# Patient Record
Sex: Female | Born: 1960 | Race: White | Hispanic: No | Marital: Married | Smoking: Never smoker
Health system: Southern US, Community
[De-identification: ages and names within clinical notes are randomized; demographics above are authoritative.]

## PROBLEM LIST (undated history)

## (undated) DIAGNOSIS — F32A Depression, unspecified: Secondary | ICD-10-CM

## (undated) DIAGNOSIS — F329 Major depressive disorder, single episode, unspecified: Secondary | ICD-10-CM

## (undated) HISTORY — PX: OTHER SURGICAL HISTORY: SHX169

## (undated) HISTORY — PX: TONSILLECTOMY: SUR1361

## (undated) HISTORY — PX: APPENDECTOMY: SHX54

---

## 2001-08-11 HISTORY — PX: ABDOMINAL HERNIA REPAIR: SHX539

## 2012-03-28 ENCOUNTER — Encounter (HOSPITAL_COMMUNITY)
Admission: EM | Disposition: A | Discharge status: Home / Self Care | Payer: Self-pay | Source: Home / Self Care | Attending: Orthopedic Surgery

## 2012-03-28 ENCOUNTER — Emergency Department (HOSPITAL_COMMUNITY): Payer: 59

## 2012-03-28 ENCOUNTER — Inpatient Hospital Stay (HOSPITAL_COMMUNITY): Payer: 59

## 2012-03-28 ENCOUNTER — Encounter (HOSPITAL_COMMUNITY): Payer: Self-pay | Admitting: Certified Registered Nurse Anesthetist

## 2012-03-28 ENCOUNTER — Encounter (HOSPITAL_COMMUNITY): Payer: Self-pay

## 2012-03-28 ENCOUNTER — Emergency Department (HOSPITAL_COMMUNITY): Payer: 59 | Admitting: Certified Registered Nurse Anesthetist

## 2012-03-28 ENCOUNTER — Inpatient Hospital Stay (HOSPITAL_COMMUNITY)
Admission: EM | Admit: 2012-03-28 | Discharge: 2012-03-30 | DRG: 494 | Disposition: A | Discharge status: Home / Self Care | Payer: 59 | Attending: Orthopedic Surgery | Admitting: Orthopedic Surgery

## 2012-03-28 DIAGNOSIS — F411 Generalized anxiety disorder: Secondary | ICD-10-CM | POA: Diagnosis present

## 2012-03-28 DIAGNOSIS — Y929 Unspecified place or not applicable: Secondary | ICD-10-CM

## 2012-03-28 DIAGNOSIS — S82843A Displaced bimalleolar fracture of unspecified lower leg, initial encounter for closed fracture: Secondary | ICD-10-CM

## 2012-03-28 DIAGNOSIS — F3289 Other specified depressive episodes: Secondary | ICD-10-CM | POA: Diagnosis present

## 2012-03-28 DIAGNOSIS — Z8249 Family history of ischemic heart disease and other diseases of the circulatory system: Secondary | ICD-10-CM

## 2012-03-28 DIAGNOSIS — W010XXA Fall on same level from slipping, tripping and stumbling without subsequent striking against object, initial encounter: Secondary | ICD-10-CM | POA: Diagnosis present

## 2012-03-28 DIAGNOSIS — F329 Major depressive disorder, single episode, unspecified: Secondary | ICD-10-CM | POA: Diagnosis present

## 2012-03-28 DIAGNOSIS — S82853A Displaced trimalleolar fracture of unspecified lower leg, initial encounter for closed fracture: Principal | ICD-10-CM | POA: Diagnosis present

## 2012-03-28 DIAGNOSIS — Z79899 Other long term (current) drug therapy: Secondary | ICD-10-CM

## 2012-03-28 HISTORY — PX: ORIF ANKLE FRACTURE: SHX5408

## 2012-03-28 HISTORY — DX: Depression, unspecified: F32.A

## 2012-03-28 HISTORY — DX: Major depressive disorder, single episode, unspecified: F32.9

## 2012-03-28 LAB — ETHANOL: Alcohol, Ethyl (B): 159 mg/dL — ABNORMAL HIGH (ref 0–11)

## 2012-03-28 LAB — CBC WITH DIFFERENTIAL/PLATELET
Basophils Relative: 1 % (ref 0–1)
Hemoglobin: 13.9 g/dL (ref 12.0–15.0)
Lymphocytes Relative: 33 % (ref 12–46)
Lymphs Abs: 2.7 10*3/uL (ref 0.7–4.0)
MCHC: 35.7 g/dL (ref 30.0–36.0)
Monocytes Relative: 6 % (ref 3–12)
Neutro Abs: 4.8 10*3/uL (ref 1.7–7.7)
Neutrophils Relative %: 58 % (ref 43–77)
RBC: 4.36 MIL/uL (ref 3.87–5.11)
WBC: 8.2 10*3/uL (ref 4.0–10.5)

## 2012-03-28 LAB — TYPE AND SCREEN

## 2012-03-28 LAB — BASIC METABOLIC PANEL
BUN: 13 mg/dL (ref 6–23)
CO2: 27 mEq/L (ref 19–32)
Chloride: 102 mEq/L (ref 96–112)
GFR calc Af Amer: >90 mL/min (ref >90)
Potassium: 3.3 mEq/L — ABNORMAL LOW (ref 3.5–5.1)

## 2012-03-28 LAB — ABO/RH: ABO/RH(D): O POS

## 2012-03-28 SURGERY — OPEN REDUCTION INTERNAL FIXATION (ORIF) ANKLE FRACTURE
Anesthesia: Regional | Site: Ankle | Laterality: Right | Wound class: Clean

## 2012-03-28 MED ORDER — ADULT MULTIVITAMIN W/MINERALS CH
1.0000 | ORAL_TABLET | Freq: Every day | ORAL | Status: DC
  Administered 2012-03-30: 1 via ORAL
  Filled 2012-03-28 (×2): qty 1

## 2012-03-28 MED ORDER — LACTATED RINGERS IV SOLN
INTRAVENOUS | Status: DC | PRN
  Administered 2012-03-28 (×2): via INTRAVENOUS

## 2012-03-28 MED ORDER — CEFAZOLIN SODIUM-DEXTROSE 2-3 GM-% IV SOLR
INTRAVENOUS | Status: DC | PRN
  Administered 2012-03-28: 2 g via INTRAVENOUS

## 2012-03-28 MED ORDER — HYDROMORPHONE HCL PF 1 MG/ML IJ SOLN
1.0000 mg | Freq: Once | INTRAMUSCULAR | Status: AC
  Administered 2012-03-28: 1 mg via INTRAVENOUS
  Filled 2012-03-28: qty 1

## 2012-03-28 MED ORDER — DEXTROSE 5 % IV SOLN: 2.0000 g | INTRAVENOUS | Status: DC | PRN

## 2012-03-28 MED ORDER — MIDAZOLAM HCL 5 MG/5ML IJ SOLN
INTRAMUSCULAR | Status: DC | PRN
  Administered 2012-03-28: 2 mg via INTRAVENOUS

## 2012-03-28 MED ORDER — FENTANYL CITRATE 0.05 MG/ML IJ SOLN
INTRAMUSCULAR | Status: DC | PRN
  Administered 2012-03-28: 100 ug via INTRAVENOUS

## 2012-03-28 MED ORDER — ETOMIDATE 2 MG/ML IV SOLN
10.0000 mg | Freq: Once | INTRAVENOUS | Status: DC
  Filled 2012-03-28: qty 10

## 2012-03-28 MED ORDER — ONDANSETRON HCL 4 MG/2ML IJ SOLN: 4.0000 mg | Freq: Four times a day (QID) | INTRAMUSCULAR | Status: DC | PRN

## 2012-03-28 MED ORDER — METOCLOPRAMIDE HCL 5 MG/ML IJ SOLN
5.0000 mg | Freq: Three times a day (TID) | INTRAMUSCULAR | Status: DC | PRN
  Administered 2012-03-30: 10 mg via INTRAVENOUS
  Filled 2012-03-28: qty 2

## 2012-03-28 MED ORDER — METHOCARBAMOL 100 MG/ML IJ SOLN
500.0000 mg | Freq: Four times a day (QID) | INTRAVENOUS | Status: DC | PRN
  Administered 2012-03-28: 500 mg via INTRAVENOUS
  Filled 2012-03-28: qty 5

## 2012-03-28 MED ORDER — CEFAZOLIN SODIUM 1-5 GM-% IV SOLN
1.0000 g | Freq: Three times a day (TID) | INTRAVENOUS | Status: AC
  Administered 2012-03-28 – 2012-03-29 (×3): 1 g via INTRAVENOUS
  Filled 2012-03-28 (×3): qty 50

## 2012-03-28 MED ORDER — COUMADIN BOOK
Freq: Once | Status: AC
  Administered 2012-03-28: 16:00:00
  Filled 2012-03-28: qty 1

## 2012-03-28 MED ORDER — ETOMIDATE 2 MG/ML IV SOLN
12.0000 mg | Freq: Once | INTRAVENOUS | Status: AC
  Administered 2012-03-28: 12 mg via INTRAVENOUS
  Filled 2012-03-28: qty 10

## 2012-03-28 MED ORDER — PHENYLEPHRINE HCL 10 MG/ML IJ SOLN
INTRAMUSCULAR | Status: DC | PRN
  Administered 2012-03-28: 120 ug via INTRAVENOUS
  Administered 2012-03-28 (×5): 80 ug via INTRAVENOUS

## 2012-03-28 MED ORDER — DIPHENHYDRAMINE HCL 50 MG/ML IJ SOLN
12.5000 mg | Freq: Once | INTRAMUSCULAR | Status: AC
  Administered 2012-03-28: 12.5 mg via INTRAVENOUS
  Filled 2012-03-28: qty 1

## 2012-03-28 MED ORDER — POTASSIUM CHLORIDE IN NACL 20-0.9 MEQ/L-% IV SOLN
INTRAVENOUS | Status: AC
  Administered 2012-03-28: via INTRAVENOUS
  Filled 2012-03-28: qty 1000

## 2012-03-28 MED ORDER — METOCLOPRAMIDE HCL 10 MG PO TABS: 5.0000 mg | ORAL_TABLET | Freq: Three times a day (TID) | ORAL | Status: DC | PRN

## 2012-03-28 MED ORDER — METHOCARBAMOL 500 MG PO TABS
500.0000 mg | ORAL_TABLET | Freq: Four times a day (QID) | ORAL | Status: DC | PRN
  Administered 2012-03-28 – 2012-03-29 (×3): 500 mg via ORAL
  Filled 2012-03-28 (×4): qty 1

## 2012-03-28 MED ORDER — OXYCODONE HCL 5 MG PO TABS
5.0000 mg | ORAL_TABLET | ORAL | Status: DC | PRN
  Administered 2012-03-28 – 2012-03-30 (×4): 10 mg via ORAL
  Filled 2012-03-28 (×4): qty 2

## 2012-03-28 MED ORDER — HYDROMORPHONE HCL PF 1 MG/ML IJ SOLN
0.2500 mg | INTRAMUSCULAR | Status: DC | PRN
  Administered 2012-03-28 (×2): 0.5 mg via INTRAVENOUS

## 2012-03-28 MED ORDER — WARFARIN - PHARMACIST DOSING INPATIENT
Freq: Every day | Status: DC
  Administered 2012-03-28: 18:00:00

## 2012-03-28 MED ORDER — BUPIVACAINE-EPINEPHRINE PF 0.5-1:200000 % IJ SOLN
INTRAMUSCULAR | Status: DC | PRN
  Administered 2012-03-28: 30 mL

## 2012-03-28 MED ORDER — EPHEDRINE SULFATE 50 MG/ML IJ SOLN
INTRAMUSCULAR | Status: DC | PRN
  Administered 2012-03-28 (×2): 10 mg via INTRAVENOUS
  Administered 2012-03-28: 15 mg via INTRAVENOUS
  Administered 2012-03-28: 5 mg via INTRAVENOUS

## 2012-03-28 MED ORDER — WARFARIN VIDEO: Freq: Once | Status: DC

## 2012-03-28 MED ORDER — 0.9 % SODIUM CHLORIDE (POUR BTL) OPTIME
TOPICAL | Status: DC | PRN
  Administered 2012-03-28: 1000 mL

## 2012-03-28 MED ORDER — ONDANSETRON HCL 4 MG/2ML IJ SOLN
4.0000 mg | Freq: Four times a day (QID) | INTRAMUSCULAR | Status: DC | PRN
  Administered 2012-03-28 – 2012-03-30 (×3): 4 mg via INTRAVENOUS
  Filled 2012-03-28 (×3): qty 2

## 2012-03-28 MED ORDER — ONDANSETRON HCL 4 MG/2ML IJ SOLN
4.0000 mg | Freq: Once | INTRAMUSCULAR | Status: AC
  Administered 2012-03-28: 4 mg via INTRAVENOUS
  Filled 2012-03-28: qty 2

## 2012-03-28 MED ORDER — HYDROMORPHONE HCL PF 1 MG/ML IJ SOLN
INTRAMUSCULAR | Status: AC
  Administered 2012-03-28: 0.5 mg via INTRAVENOUS
  Filled 2012-03-28: qty 1

## 2012-03-28 MED ORDER — PRESCRIPTION MEDICATION: 1.0000 | Freq: Every day | Status: DC

## 2012-03-28 MED ORDER — LIDOCAINE HCL (CARDIAC) 20 MG/ML IV SOLN
INTRAVENOUS | Status: DC | PRN
  Administered 2012-03-28: 70 mg via INTRAVENOUS

## 2012-03-28 MED ORDER — WARFARIN SODIUM 7.5 MG PO TABS
7.5000 mg | ORAL_TABLET | Freq: Once | ORAL | Status: AC
  Administered 2012-03-28: 7.5 mg via ORAL
  Filled 2012-03-28: qty 1

## 2012-03-28 MED ORDER — MORPHINE SULFATE 2 MG/ML IJ SOLN
1.0000 mg | INTRAMUSCULAR | Status: DC | PRN
  Administered 2012-03-28 – 2012-03-29 (×4): 1 mg via INTRAVENOUS
  Filled 2012-03-28 (×3): qty 1

## 2012-03-28 MED ORDER — ONDANSETRON HCL 4 MG PO TABS: 4.0000 mg | ORAL_TABLET | Freq: Four times a day (QID) | ORAL | Status: DC | PRN

## 2012-03-28 MED ORDER — PROPOFOL 10 MG/ML IV BOLUS
INTRAVENOUS | Status: DC | PRN
  Administered 2012-03-28: 200 mg via INTRAVENOUS

## 2012-03-28 MED ORDER — ESCITALOPRAM OXALATE 10 MG PO TABS
10.0000 mg | ORAL_TABLET | Freq: Every day | ORAL | Status: DC
  Filled 2012-03-28 (×3): qty 1

## 2012-03-28 SURGICAL SUPPLY — 71 items
BANDAGE ELASTIC 4 VELCRO ST LF (GAUZE/BANDAGES/DRESSINGS) ×2 IMPLANT
BANDAGE ELASTIC 6 VELCRO ST LF (GAUZE/BANDAGES/DRESSINGS) ×2 IMPLANT
BANDAGE GAUZE ELAST BULKY 4 IN (GAUZE/BANDAGES/DRESSINGS) IMPLANT
BIT DRILL 2.7 QC CANN 155 (BIT) ×2 IMPLANT
BIT DRILL 3.5 QC 155 (BIT) ×2 IMPLANT
BIT DRILL QC 2.7 6.3IN  SHORT (BIT) ×2
BIT DRILL QC 2.7 6.3IN SHORT (BIT) ×2 IMPLANT
BLADE SURG 10 STRL SS (BLADE) IMPLANT
BNDG COHESIVE 6X5 TAN STRL LF (GAUZE/BANDAGES/DRESSINGS) ×2 IMPLANT
BNDG ESMARK 4X9 LF (GAUZE/BANDAGES/DRESSINGS) ×2 IMPLANT
CLOTH BEACON ORANGE TIMEOUT ST (SAFETY) ×2 IMPLANT
CONT SPEC 4OZ CLIKSEAL STRL BL (MISCELLANEOUS) ×4 IMPLANT
COVER MAYO STAND STRL (DRAPES) ×2 IMPLANT
COVER SURGICAL LIGHT HANDLE (MISCELLANEOUS) ×2 IMPLANT
CUFF TOURNIQUET SINGLE 34IN LL (TOURNIQUET CUFF) ×2 IMPLANT
CUFF TOURNIQUET SINGLE 44IN (TOURNIQUET CUFF) IMPLANT
DRAPE C-ARM 42X72 X-RAY (DRAPES) ×2 IMPLANT
DRAPE INCISE IOBAN 66X45 STRL (DRAPES) ×2 IMPLANT
DRAPE SURG 17X23 STRL (DRAPES) ×2 IMPLANT
DRAPE U-SHAPE 47X51 STRL (DRAPES) ×2 IMPLANT
DRSG PAD ABDOMINAL 8X10 ST (GAUZE/BANDAGES/DRESSINGS) ×2 IMPLANT
DURAPREP 26ML APPLICATOR (WOUND CARE) ×2 IMPLANT
ELECT REM PT RETURN 9FT ADLT (ELECTROSURGICAL) ×2
ELECTRODE REM PT RTRN 9FT ADLT (ELECTROSURGICAL) ×1 IMPLANT
FACESHIELD LNG OPTICON STERILE (SAFETY) IMPLANT
GAUZE XEROFORM 5X9 LF (GAUZE/BANDAGES/DRESSINGS) ×2 IMPLANT
GLOVE BIOGEL PI IND STRL 8 (GLOVE) ×2 IMPLANT
GLOVE BIOGEL PI INDICATOR 8 (GLOVE) ×2
GLOVE SURG ORTHO 8.0 STRL STRW (GLOVE) ×2 IMPLANT
GOWN PREVENTION PLUS LG XLONG (DISPOSABLE) ×2 IMPLANT
GOWN STRL NON-REIN LRG LVL3 (GOWN DISPOSABLE) ×6 IMPLANT
GUIDE PIN 1.3 (Pin) ×4 IMPLANT
HANDPIECE INTERPULSE COAX TIP (DISPOSABLE)
KIT BASIN OR (CUSTOM PROCEDURE TRAY) ×4 IMPLANT
KIT ROOM TURNOVER OR (KITS) ×2 IMPLANT
MANIFOLD NEPTUNE II (INSTRUMENTS) ×2 IMPLANT
NEEDLE HYPO 25GX1X1/2 BEV (NEEDLE) ×2 IMPLANT
NS IRRIG 1000ML POUR BTL (IV SOLUTION) ×2 IMPLANT
PACK ORTHO EXTREMITY (CUSTOM PROCEDURE TRAY) ×2 IMPLANT
PAD ARMBOARD 7.5X6 YLW CONV (MISCELLANEOUS) ×4 IMPLANT
PAD CAST 4YDX4 CTTN HI CHSV (CAST SUPPLIES) ×1 IMPLANT
PADDING CAST COTTON 4X4 STRL (CAST SUPPLIES) ×1
PIN GUIDE 1.3 (Pin) ×2 IMPLANT
PLATE FIBULA 5 HOLE (Plate) ×2 IMPLANT
SCREW CANC 5.0X14 (Screw) ×2 IMPLANT
SCREW CANN 4.0 46MM (Screw) ×4 IMPLANT
SCREW LOCK 12X3.5XST PRLC (Screw) ×1 IMPLANT
SCREW LOCK 3.5X12 (Screw) ×1 IMPLANT
SCREW LOCK 3.5X16 (Screw) ×2 IMPLANT
SCREW LOCK 3.5X8 (Screw) ×2 IMPLANT
SCREW NON LOCK 3.5X12 (Screw) ×2 IMPLANT
SCREW NONLOCK 3.5X10 (Screw) ×4 IMPLANT
SCREW NONLOCK 3.5X18 (Screw) ×2 IMPLANT
SET HNDPC FAN SPRY TIP SCT (DISPOSABLE) IMPLANT
SPLINT PLASTER CAST XFAST 5X30 (CAST SUPPLIES) ×1 IMPLANT
SPLINT PLASTER XFAST SET 5X30 (CAST SUPPLIES) ×1
SPONGE GAUZE 4X4 12PLY (GAUZE/BANDAGES/DRESSINGS) ×2 IMPLANT
STOCKINETTE IMPERVIOUS 9X36 MD (GAUZE/BANDAGES/DRESSINGS) IMPLANT
SUCTION FRAZIER TIP 10 FR DISP (SUCTIONS) ×2 IMPLANT
SUT ETHILON 3 0 PS 1 (SUTURE) ×8 IMPLANT
SUT VIC AB 2-0 CTB1 (SUTURE) ×2 IMPLANT
SUT VIC AB 3-0 PS2 18 (SUTURE) ×3
SUT VIC AB 3-0 PS2 18XBRD (SUTURE) ×3 IMPLANT
SUT VIC AB 3-0 SH 27 (SUTURE)
SUT VIC AB 3-0 SH 27X BRD (SUTURE) IMPLANT
SYR CONTROL 10ML LL (SYRINGE) ×2 IMPLANT
TOWEL OR 17X24 6PK STRL BLUE (TOWEL DISPOSABLE) ×2 IMPLANT
TOWEL OR 17X26 10 PK STRL BLUE (TOWEL DISPOSABLE) ×2 IMPLANT
TUBE CONNECTING 12X1/4 (SUCTIONS) ×2 IMPLANT
WATER STERILE IRR 1000ML POUR (IV SOLUTION) ×2 IMPLANT
YANKAUER SUCT BULB TIP NO VENT (SUCTIONS) IMPLANT

## 2012-03-28 NOTE — ED Provider Notes (Addendum)
History     CSN: [id number]  Arrival date & time 03/28/12  4098   First MD Initiated Contact with Patient 03/28/12 [phone]      Chief Complaint  Patient presents with  . Ankle Pain  . Leg Injury    (Consider location/radiation/quality/duration/timing/severity/associated sxs/prior treatment) HPI 51 year old female presents to the emergency department with complaint of right ankle pain and deformity. She and her husband are walking home from a friend's house when she slipped on some wet grass. Patient hasn't been unable to bear weight since the injury. She reports last time she ate was around 7 PM, then had drinks at a friend's house. She denies any medical problems, no prior orthopedic injuries or surgeries. No allergies   No past medical history on file.  No past surgical history on file.  Family History  Problem Relation Age of Onset  . ALS Mother   . Heart attack Father     History  Substance Use Topics  . Smoking status: Never Smoker   . Smokeless tobacco: Not on file  . Alcohol Use: 15.0 oz/week    25 Glasses of wine per week    OB History    Grav Para Term Preterm Abortions TAB SAB Ect Mult Living   2 2 2              Review of Systems  All other systems reviewed and are negative.    Allergies  Review of patient's allergies indicates no known allergies.  Home Medications   Current Outpatient Rx  Name Route Sig Dispense Refill  . ESCITALOPRAM OXALATE 10 MG PO TABS Oral Take 10 mg by mouth at bedtime.    . ADULT MULTIVITAMIN W/MINERALS CH Oral Take 1 tablet by mouth daily.    Marland Kitchen PRESCRIPTION MEDICATION Oral Take 1 tablet by mouth daily. Oral contraceptive      BP 120/85  Pulse 82  Temp 97.7 F (36.5 C) (Oral)  Resp 15  Ht 5\' 10"  (1.778 m)  Wt 170 lb (77.111 kg)  BMI 24.39 kg/m2  SpO2 97%  LMP 03/14/2012  Physical Exam  Nursing note and vitals reviewed. Constitutional: She is oriented to person, place, and time. She appears well-developed and  well-nourished.  HENT:  Head: Normocephalic and atraumatic.  Nose: Nose normal.  Mouth/Throat: Oropharynx is clear and moist.  Eyes: Conjunctivae and EOM are normal. Pupils are equal, round, and reactive to light.  Neck: Normal range of motion. Neck supple. No JVD present. No tracheal deviation present. No thyromegaly present.  Cardiovascular: Normal rate, regular rhythm, normal heart sounds and intact distal pulses.  Exam reveals no gallop and no friction rub.   No murmur heard. Pulmonary/Chest: Effort normal and breath sounds normal. No stridor. No respiratory distress. She has no wheezes. She has no rales. She exhibits no tenderness.  Abdominal: Soft. Bowel sounds are normal. She exhibits no distension and no mass. There is no tenderness. There is no rebound and no guarding.  Musculoskeletal: She exhibits edema and tenderness.       Patient with deformity of right ankle with tenting of the skin over the medial malleolus. Patient with distal pulses and sensation intact. There is significant soft tissue swelling  Lymphadenopathy:    She has no cervical adenopathy.  Neurological: She is alert and oriented to person, place, and time.  Skin: Skin is warm and dry. No rash noted. No erythema. No pallor.  Psychiatric: She has a normal mood and affect. Her behavior is normal. Judgment  and thought content normal.    ED Course  Reduction of fracture Date/Time: 03/28/2012 4:46 AM Performed by: Olivia Mackie Authorized by: Olivia Mackie Consent: Verbal consent obtained. Written consent obtained. Risks and benefits: risks, benefits and alternatives were discussed Consent given by: patient Patient understanding: patient states understanding of the procedure being performed Patient consent: the patient's understanding of the procedure matches consent given Procedure consent: procedure consent matches procedure scheduled Relevant documents: relevant documents present and verified Test results: test  results available and properly labeled Site marked: the operative site was marked Imaging studies: imaging studies available Required items: required blood products, implants, devices, and special equipment available Patient identity confirmed: verbally with patient Time out: Immediately prior to procedure a "time out" was called to verify the correct patient, procedure, equipment, support staff and site/side marked as required. Local anesthesia used: no Patient sedated: yes Sedation type: moderate (conscious) sedation Sedatives: etomidate Sedation start date/time: 03/28/2012 4:46 AM Sedation end date/time: 03/28/2012 5:00 AM Vitals: Vital signs were monitored during sedation. Patient tolerance: Patient tolerated the procedure well with no immediate complications. Comments: Right ankle with deformity, with distraction and lateral angulation able to easily reduce into a more anatomic position without skin tenting.  Procedural sedation Date/Time: 03/28/2012 4:46 AM Performed by: Olivia Mackie Authorized by: Olivia Mackie Consent: Verbal consent obtained. Written consent obtained. Risks and benefits: risks, benefits and alternatives were discussed Consent given by: patient Patient understanding: patient states understanding of the procedure being performed Patient consent: the patient's understanding of the procedure matches consent given Procedure consent: procedure consent matches procedure scheduled Relevant documents: relevant documents present and verified Test results: test results available and properly labeled Site marked: the operative site was marked Imaging studies: imaging studies available Required items: required blood products, implants, devices, and special equipment available Patient identity confirmed: verbally with patient Time out: Immediately prior to procedure a "time out" was called to verify the correct patient, procedure, equipment, support staff and site/side  marked as required. Patient sedated: yes Sedatives: etomidate Sedation start date/time: 03/28/2012 4:46 AM Sedation end date/time: 03/28/2012 5:00 AM Vitals: Vital signs were monitored during sedation. Patient tolerance: Patient tolerated the procedure well with no immediate complications.   (including critical care time)    Labs Reviewed  BASIC METABOLIC PANEL - Abnormal; Notable for the following:    Potassium 3.3 (*)     All other components within normal limits  ETHANOL - Abnormal; Notable for the following:    Alcohol, Ethyl (B) 159 (*)     All other components within normal limits  CBC WITH DIFFERENTIAL  TYPE AND SCREEN  ABO/RH   Dg Chest 2 View  03/28/2012  *RADIOLOGY REPORT*  Clinical Data: Fall.  Ankle fracture.  CHEST - 2 VIEW  Comparison: None.  Findings: Mild hyperinflation. The heart size and pulmonary vascularity are normal. The lungs appear clear and expanded without focal air space disease or consolidation. No blunting of the costophrenic angles.  No pneumothorax.  Mediastinal contours appear intact.  IMPRESSION: No evidence of active pulmonary disease.  Original Report Authenticated By: Marlon Pel, M.D.   Dg Ankle 2 Views Right  03/28/2012  *RADIOLOGY REPORT*  Clinical Data: Postreduction distal right tib-fib fractures.  RIGHT ANKLE - 2 VIEW  Comparison: 03/28/2012 at 0357 hours  Findings: Right medial and lateral malleolar fractures again demonstrated.  There is improved angulation and position although there is still residual lateral displacement of distal fracture fragment and talus.  Fractures  can also be seen along the anterior and posterior malleoli of the distal tibia.  Diffuse soft tissue swelling.  IMPRESSION: Improved position of fracture fragments with mild residual lateral displacement of the medial and lateral malleolar fractures. Additionally, fractures of the posterior and anterior tibial malleoli are now demonstrated.  Original Report Authenticated  By: Marlon Pel, M.D.   Dg Ankle Right Port  03/28/2012  *RADIOLOGY REPORT*  Clinical Data: Pain after injury to the right ankle.  PORTABLE RIGHT ANKLE - 2 VIEW  Comparison: None.  Findings: Transverse fracture of the medial malleolus extending to the articular surface.  Lateral displacement of the distal fracture fragment and lateral dislocation of the talus with respect to the tibia.  Oblique comminuted fracture of the distal fibula with overriding of fracture fragments and lateral angulation of distal fracture fragment.  Tiny osseous fragment adjacent to the cuboidal bone may represent avulsion fragment.  Soft tissue swelling.  No focal bone lesion or bone destruction.  IMPRESSION: Displaced and angulated bimalleolar fractures of the right ankle with possible avulsion fracture adjacent to the cuboidal bone.  Original Report Authenticated By: Marlon Pel, M.D.    Date: 03/28/2012  Rate: 74  Rhythm: normal sinus rhythm  QRS Axis: normal  Intervals: QT prolonged  ST/T Wave abnormalities: normal  Conduction Disutrbances:none  Narrative Interpretation:   Old EKG Reviewed: none available    1. Bimalleolar ankle fracture       MDM  51 year old female with bimalleolar fracture of right ankle with tenting of the skin over medial malleolus. Discussed the case with Dr. August Saucer who will be in to see the patient implants take to surgery. Given the tenting of the skin, he is okay with me doing conscious sedation and reduction of the fracture. Patient tolerated conscious sedation well. After reduction patient has good pulses, sensation and is moving her toes well. The skin overlying the medial malleolus is no longer discolored. Patient to have post reduction x-rays, also to have preop chest x-ray.         Olivia Mackie, MD 03/28/12 9811  Olivia Mackie, MD 03/28/12 [phone]

## 2012-03-28 NOTE — Consult Note (Signed)
Reason for Consult: Right ankle pain Referring Physician: Dr.otter  Erin Whitehead is an 51 y.o. female.  HPI: Erin Whitehead is a 51 year old female who was walking last night when she slipped and injured her right ankle. She was unable to weight-bear and presented to the emergency room for further management. In the emergency room she was found to have a displaced bimalleolar ankle fracture. Under conscious sedation the fracture was and splinted.reduced. Patient denies any other orthopedic complaints. There is no family history of pulmonary embolism or DVT. Patient has  no stairs in her house she works as a Veterinary surgeon at [employer]  History reviewed. No pertinent past medical history.  History reviewed. No pertinent past surgical history.  Family History  Problem Relation Age of Onset  . ALS Mother   . Heart attack Father     Social History:  reports that she has never smoked. She does not have any smokeless tobacco history on file. She reports that she drinks about 15 ounces of alcohol per week. She reports that she does not use illicit drugs.  Allergies: No Known Allergies  Medications: I have reviewed the patient's current medications.  Results for orders placed during the hospital encounter of 03/28/12 (from the past 48 hour(s))  CBC WITH DIFFERENTIAL     Status: Normal   Collection Time   03/28/12  3:52 AM      Component Value Range Comment   WBC 8.2  4.0 - 10.5 K/uL    RBC 4.36  3.87 - 5.11 MIL/uL    Hemoglobin 13.9  12.0 - 15.0 g/dL    HCT 16.1  09.6 - 04.5 %    MCV 89.2  78.0 - 100.0 fL    MCH 31.9  26.0 - 34.0 pg    MCHC 35.7  30.0 - 36.0 g/dL    RDW 40.9  81.1 - 91.4 %    Platelets 279  150 - 400 K/uL    Neutrophils Relative 58  43 - 77 %    Neutro Abs 4.8  1.7 - 7.7 K/uL    Lymphocytes Relative 33  12 - 46 %    Lymphs Abs 2.7  0.7 - 4.0 K/uL    Monocytes Relative 6  3 - 12 %    Monocytes Absolute 0.5  0.1 - 1.0 K/uL    Eosinophils Relative 2  0 - 5 %    Eosinophils Absolute 0.2   0.0 - 0.7 K/uL    Basophils Relative 1  0 - 1 %    Basophils Absolute 0.0  0.0 - 0.1 K/uL   BASIC METABOLIC PANEL     Status: Abnormal   Collection Time   03/28/12  3:52 AM      Component Value Range Comment   Sodium 139  135 - 145 mEq/L    Potassium 3.3 (*) 3.5 - 5.1 mEq/L    Chloride 102  96 - 112 mEq/L    CO2 27  19 - 32 mEq/L    Glucose, Bld 95  70 - 99 mg/dL    BUN 13  6 - 23 mg/dL    Creatinine, Ser 7.82  0.50 - 1.10 mg/dL    Calcium 8.7  8.4 - 95.6 mg/dL    GFR calc non Af Amer >90  >90 mL/min    GFR calc Af Amer >90  >90 mL/min   ETHANOL     Status: Abnormal   Collection Time   03/28/12  3:52 AM  Component Value Range Comment   Alcohol, Ethyl (B) 159 (*) 0 - 11 mg/dL   TYPE AND SCREEN     Status: Normal   Collection Time   03/28/12  5:00 AM      Component Value Range Comment   ABO/RH(D) O POS      Antibody Screen NEG      Sample Expiration 03/31/2012     ABO/RH     Status: Normal (Preliminary result)   Collection Time   03/28/12  5:00 AM      Component Value Range Comment   ABO/RH(D) O POS       Dg Chest 2 View  03/28/2012  *RADIOLOGY REPORT*  Clinical Data: Fall.  Ankle fracture.  CHEST - 2 VIEW  Comparison: None.  Findings: Mild hyperinflation. The heart size and pulmonary vascularity are normal. The lungs appear clear and expanded without focal air space disease or consolidation. No blunting of the costophrenic angles.  No pneumothorax.  Mediastinal contours appear intact.  IMPRESSION: No evidence of active pulmonary disease.  Original Report Authenticated By: Marlon Pel, M.D.   Dg Ankle 2 Views Right  03/28/2012  *RADIOLOGY REPORT*  Clinical Data: Postreduction distal right tib-fib fractures.  RIGHT ANKLE - 2 VIEW  Comparison: 03/28/2012 at 0357 hours  Findings: Right medial and lateral malleolar fractures again demonstrated.  There is improved angulation and position although there is still residual lateral displacement of distal fracture fragment and  talus.  Fractures can also be seen along the anterior and posterior malleoli of the distal tibia.  Diffuse soft tissue swelling.  IMPRESSION: Improved position of fracture fragments with mild residual lateral displacement of the medial and lateral malleolar fractures. Additionally, fractures of the posterior and anterior tibial malleoli are now demonstrated.  Original Report Authenticated By: Marlon Pel, M.D.   Dg Ankle Right Port  03/28/2012  *RADIOLOGY REPORT*  Clinical Data: Pain after injury to the right ankle.  PORTABLE RIGHT ANKLE - 2 VIEW  Comparison: None.  Findings: Transverse fracture of the medial malleolus extending to the articular surface.  Lateral displacement of the distal fracture fragment and lateral dislocation of the talus with respect to the tibia.  Oblique comminuted fracture of the distal fibula with overriding of fracture fragments and lateral angulation of distal fracture fragment.  Tiny osseous fragment adjacent to the cuboidal bone may represent avulsion fragment.  Soft tissue swelling.  No focal bone lesion or bone destruction.  IMPRESSION: Displaced and angulated bimalleolar fractures of the right ankle with possible avulsion fracture adjacent to the cuboidal bone.  Original Report Authenticated By: Marlon Pel, M.D.    Review of Systems  Constitutional: Negative.   HENT: Negative.   Eyes: Negative.   Respiratory: Negative.   Cardiovascular: Negative.   Gastrointestinal: Negative.   Genitourinary: Negative.   Musculoskeletal: Positive for joint pain.  Skin: Negative.   Neurological: Negative.   Endo/Heme/Allergies: Negative.   Psychiatric/Behavioral: Negative.    Blood pressure 111/69, pulse 73, temperature 97.7 F (36.5 C), temperature source Oral, resp. rate 9, height 5\' 10"  (1.778 m), weight 77.111 kg (170 lb), last menstrual period 03/14/2012, SpO2 100.00%. Physical Exam  Constitutional: She is oriented to person, place, and time. She appears  well-developed.  HENT:  Head: Normocephalic.  Eyes: Pupils are equal, round, and reactive to light.  Neck: Normal range of motion.  Cardiovascular: Normal rate.   Respiratory: Effort normal.  GI: Soft.  Neurological: She is alert and oriented to person, place, and  time.  Skin: Skin is warm.  Psychiatric: She has a normal mood and affect. Her behavior is normal. Judgment and thought content normal.   examination of the right foot and ankle demonstrates that the foot is splinted. Sensation is intact on the dorsal and plantar aspect of the foot. Toe dorsiflexion plantarflexion is intact. Toes are perfused.  Assessment/Plan: Impression is displaced bimalleolar ankle fracture with small trimalleolar fragment. This is an unstable fracture pattern. Patient needs open reduction internal fixation. Risk and benefits are discussed with the patient including but not limited to infection nerve vessel damage need for hardware removal nonunion and malunion. Patient is not a smoker and there is no history of pulmonary embolism. The posterior malleolar fragment will not need to be repaired. Anticipate overnight hospital stay. Also plan on DVT prophylaxis with Coumadin. All questions answered. Medical decision making complicated today by decision for surgery.  DEAN,GREGORY SCOTT 03/28/2012, 8:12 AM

## 2012-03-28 NOTE — Preoperative (Signed)
Beta Blockers   Reason not to administer Beta Blockers:Not Applicable 

## 2012-03-28 NOTE — ED Notes (Signed)
Per previous report. Pt Slipped in wet back, landing in hole. Immediately called EMS for eval of R ankle pain and deformity.

## 2012-03-28 NOTE — ED Notes (Signed)
The patient was walking in wet grass when she slipped and twisted her ankle.  She presents to the ER in severe pain despite Fentanyl 50 mg, iv x 5 doses en route from EMS.  Her ankle is severely swollen and deformed with tenting under the skin.

## 2012-03-28 NOTE — Consult Note (Signed)
ANTICOAGULATION CONSULT NOTE - Initial Consult  Pharmacy Consult for Coumadin Indication: VTE prophylaxis  No Known Allergies  Patient Measurements: Height: 5\' 10"  (177.8 cm) Weight: 170 lb (77.111 kg) IBW/kg (Calculated) : 68.5   Vital Signs: Temp: 98.8 F (37.1 C) (08/18 1430) Temp src: Oral (08/18 0354) BP: 123/74 mmHg (08/18 1445) Pulse Rate: 88  (08/18 1445)  Labs:  Basename 03/28/12 0352  HGB 13.9  HCT 38.9  PLT 279  APTT --  LABPROT --  INR --  HEPARINUNFRC --  CREATININE 0.78  CKTOTAL --  CKMB --  TROPONINI --    Estimated Creatinine Clearance: 87.9 ml/min (by C-G formula based on Cr of 0.78).   Medical History: Past Medical History  Diagnosis Date  . Depression    Assessment: 51yof s/p ORIF right ankle to begin coumadin for VTE prophylaxis. No baseline INR but assume normal. Coumadin score=5.  Goal of Therapy:  INR 2-3 Monitor platelets by anticoagulation protocol: Yes   Plan:  1) Coumadin 7.5mg  x 1 2) Daily INR 3) Coumadin education-book/video  Fredrik Rigger 03/28/2012,3:47 PM

## 2012-03-28 NOTE — Anesthesia Procedure Notes (Addendum)
Anesthesia Regional Block:  Popliteal block  Pre-Anesthetic Checklist: ,, timeout performed, Correct Patient, Correct Site, Correct Laterality, Correct Procedure, Correct Position, site marked, Risks and benefits discussed,  Surgical consent,  Pre-op evaluation,  At surgeon's request and post-op pain management  Laterality: Right  Prep: chloraprep       Needles:  Injection technique: Single-shot  Needle Type: Echogenic Stimulator Needle          Additional Needles:  Procedures: ultrasound guided and nerve stimulator Popliteal block  Nerve Stimulator or Paresthesia:  Response: plantar flexion of foot, 0.45 mA,   Additional Responses:   Narrative:  Start time: 03/28/2012 10:10 AM End time: 03/28/2012 10:35 AM Injection made incrementally with aspirations every 5 mL.  Performed by: Personally  Anesthesiologist: Dr Chaney Malling  Additional Notes: Functioning IV was confirmed and monitors were applied.  A 90mm 21ga Arrow echogenic stimulator needle was used. Sterile prep and drape,hand hygiene and sterile gloves were used.  Negative aspiration and negative test dose prior to incremental administration of local anesthetic. The patient tolerated the procedure well.  Ultrasound guidance: relevent anatomy identified, needle position confirmed, local anesthetic spread visualized around nerve(s), vascular puncture avoided.  Image printed for medical record.   Popliteal block Procedure Name: LMA Insertion Date/Time: 03/28/2012 11:28 AM Performed by: Kirt Boys P Pre-anesthesia Checklist: Patient identified, Emergency Drugs available, Suction available, Patient being monitored and Timeout performed Patient Re-evaluated:Patient Re-evaluated prior to inductionOxygen Delivery Method: Circle system utilized Preoxygenation: Pre-oxygenation with 100% oxygen Intubation Type: IV induction LMA: LMA inserted LMA Size: 4.0 Number of attempts: 1 Placement Confirmation: positive ETCO2 and  breath sounds checked- equal and bilateral Tube secured with: Tape Dental Injury: Teeth and Oropharynx as per pre-operative assessment

## 2012-03-28 NOTE — Anesthesia Postprocedure Evaluation (Signed)
Anesthesia Post Note  Patient: Erin Whitehead  Procedure(s) Performed: Procedure(s) (LRB): OPEN REDUCTION INTERNAL FIXATION (ORIF) ANKLE FRACTURE (Right)  Anesthesia type: General  Patient location: PACU  Post pain: Pain level controlled and Adequate analgesia  Post assessment: Post-op Vital signs reviewed, Patient's Cardiovascular Status Stable, Respiratory Function Stable, Patent Airway and Pain level controlled  Last Vitals:  Filed Vitals:   03/28/12 1445  BP:   Pulse: 88  Temp:   Resp: 11    Post vital signs: Reviewed and stable  Level of consciousness: awake, alert  and oriented  Complications: No apparent anesthesia complications

## 2012-03-28 NOTE — ED Notes (Signed)
The patient signed the consent form to allow Dr. Norlene Campbell to reduce her open fracture.

## 2012-03-28 NOTE — Brief Op Note (Signed)
03/28/2012  1:42 PM  PATIENT:  Erin Whitehead  51 y.o. female  PRE-OPERATIVE DIAGNOSIS:  right ankle fracture  POST-OPERATIVE DIAGNOSIS:  right ankle fracture  PROCEDURE:  Procedure(s): OPEN REDUCTION INTERNAL FIXATION (ORIF) ANKLE FRACTURE, trimalleolar with fixation of bimalleolar, syndesmosis stable - post malleolus reduced  SURGEON:  Surgeon(s): Cammy Copa, MD  ASSISTANT: none  ANESTHESIA:   general  EBL: 50 ml    Total I/O In: 1000 [I.V.:1000] Out: -   BLOOD ADMINISTERED: none  DRAINS: none   LOCAL MEDICATIONS USED:  none  SPECIMEN:  No Specimen  COUNTS:  YES  TOURNIQUET:  * Missing tourniquet times found for documented tourniquets in log:  [id number] *  DICTATION: .Other Dictation: Dictation Number [id number]  PLAN OF CARE: Admit to inpatient   PATIENT DISPOSITION:  PACU - hemodynamically stable

## 2012-03-28 NOTE — ED Notes (Signed)
Patient transported to X-ray 

## 2012-03-28 NOTE — ED Notes (Signed)
Report given to [facility] CRNA.

## 2012-03-28 NOTE — Anesthesia Preprocedure Evaluation (Addendum)
Anesthesia Evaluation  Patient identified by MRN, date of birth, ID band Patient awake    Reviewed: Allergy & Precautions, H&P , NPO status , Patient's Chart, lab work & pertinent test results  Airway Mallampati: II TM Distance: >3 FB Neck ROM: full    Dental  (+) Dental Advisory Given   Pulmonary          Cardiovascular     Neuro/Psych PSYCHIATRIC DISORDERS Anxiety Depression    GI/Hepatic   Endo/Other    Renal/GU      Musculoskeletal   Abdominal   Peds  Hematology   Anesthesia Other Findings   Reproductive/Obstetrics                          Anesthesia Physical Anesthesia Plan  ASA: I  Anesthesia Plan: General and Regional   Post-op Pain Management: MAC Combined w/ Regional for Post-op pain   Induction: Intravenous  Airway Management Planned: LMA  Additional Equipment:   Intra-op Plan:   Post-operative Plan:   Informed Consent: I have reviewed the patients History and Physical, chart, labs and discussed the procedure including the risks, benefits and alternatives for the proposed anesthesia with the patient or authorized representative who has indicated his/her understanding and acceptance.   Dental advisory given  Plan Discussed with: CRNA, Surgeon and Anesthesiologist  Anesthesia Plan Comments:        Anesthesia Quick Evaluation

## 2012-03-28 NOTE — ED Notes (Signed)
Diamond wedding band and 1 diamond earring placed in specimen cup, patient holding cup in bed awaiting for husband to arrive to give belongings too otherwise pt informed security would lock jewelry in lock box.

## 2012-03-28 NOTE — Transfer of Care (Signed)
Immediate Anesthesia Transfer of Care Note  Patient: Erin Whitehead  Procedure(s) Performed: Procedure(s) (LRB): OPEN REDUCTION INTERNAL FIXATION (ORIF) ANKLE FRACTURE (Right)  Patient Location: PACU  Anesthesia Type: General  Level of Consciousness: awake, alert , oriented and patient cooperative  Airway & Oxygen Therapy: Patient Spontanous Breathing and Patient connected to nasal cannula oxygen  Post-op Assessment: Report given to PACU RN, Post -op Vital signs reviewed and stable and Patient moving all extremities X 4  Post vital signs: Reviewed and stable  Complications: No apparent anesthesia complications

## 2012-03-29 ENCOUNTER — Encounter (HOSPITAL_COMMUNITY): Payer: Self-pay | Admitting: Orthopedic Surgery

## 2012-03-29 LAB — PROTIME-INR: Prothrombin Time: 14.7 seconds (ref 11.6–15.2)

## 2012-03-29 MED ORDER — WARFARIN SODIUM 7.5 MG PO TABS
7.5000 mg | ORAL_TABLET | Freq: Once | ORAL | Status: AC
  Administered 2012-03-29: 7.5 mg via ORAL
  Filled 2012-03-29: qty 1

## 2012-03-29 MED ORDER — NALOXONE HCL 0.4 MG/ML IJ SOLN: 0.4000 mg | INTRAMUSCULAR | Status: DC | PRN

## 2012-03-29 MED ORDER — DIPHENHYDRAMINE HCL 50 MG/ML IJ SOLN: 12.5000 mg | Freq: Four times a day (QID) | INTRAMUSCULAR | Status: DC | PRN

## 2012-03-29 MED ORDER — HYDROMORPHONE 0.3 MG/ML IV SOLN
INTRAVENOUS | Status: DC
  Administered 2012-03-29 (×2): via INTRAVENOUS
  Administered 2012-03-29 – 2012-03-30 (×2): 0.9 mg via INTRAVENOUS
  Administered 2012-03-30: 2.93 mg via INTRAVENOUS
  Administered 2012-03-30: 3.9 mg via INTRAVENOUS
  Administered 2012-03-30: 05:00:00 via INTRAVENOUS
  Administered 2012-03-30: 3.8 mg via INTRAVENOUS
  Filled 2012-03-29 (×3): qty 25

## 2012-03-29 MED ORDER — SODIUM CHLORIDE 0.9 % IJ SOLN: 9.0000 mL | INTRAMUSCULAR | Status: DC | PRN

## 2012-03-29 MED ORDER — KETOROLAC TROMETHAMINE 30 MG/ML IJ SOLN
30.0000 mg | Freq: Four times a day (QID) | INTRAMUSCULAR | Status: DC
  Administered 2012-03-29 – 2012-03-30 (×5): 30 mg via INTRAVENOUS
  Filled 2012-03-29: qty 2
  Filled 2012-03-29 (×6): qty 1

## 2012-03-29 MED ORDER — DIPHENHYDRAMINE HCL 12.5 MG/5ML PO ELIX: 12.5000 mg | ORAL_SOLUTION | Freq: Four times a day (QID) | ORAL | Status: DC | PRN

## 2012-03-29 MED ORDER — ONDANSETRON HCL 4 MG/2ML IJ SOLN: 4.0000 mg | Freq: Four times a day (QID) | INTRAMUSCULAR | Status: DC | PRN

## 2012-03-29 NOTE — Op Note (Signed)
NAMEJULIANN, Erin Whitehead                 ACCOUNT NO.:  [id number]  MEDICAL RECORD NO.:  [id number]  LOCATION:  [facility]                         FACILITY:  [facility]  PHYSICIAN:  Burnard Bunting, M.D.    DATE OF BIRTH:  08-02-1961  DATE OF PROCEDURE:  03/28/2012 DATE OF DISCHARGE:                              OPERATIVE REPORT   PREOPERATIVE DIAGNOSIS:  Unstable trimalleolar right ankle fracture.  POSTOPERATIVE DIAGNOSIS:  Unstable trimalleolar right ankle fracture.  PROCEDURE:  Open reduction and internal fixation of trimalleolar ankle fracture with fixation of medial and lateral malleoli.  SURGEON:  Burnard Bunting, M.D.  ASSISTANT:  None.  ANESTHESIA:  General endotracheal.  ESTIMATED BLOOD LOSS:  25 mL.  DRAINS:  None.  INDICATIONS:  Erin Whitehead is a 51 year old patient with right ankle bimalleolar fracture unstable, presents for operative management after explanation of risks and benefits.  IMPLANTS USED:  Smith [employer]hew lateral plate on the lateral side with a cannulated screws x2 on the medial side.  PROCEDURE IN DETAIL:  The patient was brought to the operating room where general endotracheal anesthesia was induced.  Preoperative antibiotics were administered.  Time-out was called.  Right ankle was prescrubbed with alcohol and Betadine, which allowed to air dry, prepped with DuraPrep solution and draped in a sterile manner.  Collier Flowers was used to cover the operative field.  An ankle Esmarch was utilized for approximately an hour.  Lateral incision was made.  Skin and subcutaneous tissues were sharply divided.  Care was taken to avoid injury to branches of superficial peroneal nerve.  Fracture site was identified.  Oblique fracture was reduced and lag screw was placed, 3.5 mm.  Lateral plate was then applied centrally on the shaft and head of the fibula, which was on the shaft and malleoli of the fibula. Nonlocking screws placed proximally and locking screws placed distally. Secure  fixation was achieved.  Irrigation was performed and then a wet- moist sponge was placed into the incision.  Attention was then directed medially.  Incision was made, centered over the fracture site.  Skin and subcutaneous tissues sharply divided.  The saphenous nerve and vein were mobilized anteriorly.  Fracture site was visualized and joint was thoroughly irrigated through the fracture site.  Fracture was then reduced and 2 screws were placed, 4-0 cannulated cancellous screws. This gave excellent fixation.  The patient's bone quality was good. Screw placement was confirmed in the AP and lateral planes under fluoroscopy.  At this time, tourniquet was released.  Bleeding points were encountered and closed with electrocautery.  3 L of irrigating solution were used to irrigate the medial and lateral side.  Incisions were closed using interrupted inverted 2-0 Vicryl, 3-0 Vicryl, and 3-0 nylon.  Bulky and well-padded posterior splint was applied.  The patient tolerated the procedure well without immediate complications and transferred to recovery room in stable condition.     Burnard Bunting, M.D.     GSD/MEDQ  D:  03/28/2012  T:  03/29/2012  Job:  [id number]

## 2012-03-29 NOTE — Progress Notes (Signed)
Pt stable but pain worse off block Toes mobile perfused and sensate right Add dilaudid pca And 1 day of toradol Possible dc am

## 2012-03-29 NOTE — Evaluation (Signed)
Physical Therapy Evaluation Patient Details Name: Erin Whitehead MRN: [id number] DOB: 03/26/1961 Today's Date: 03/29/2012 Time: 1450-1520 PT Time Calculation (min): 30 min  PT Assessment / Plan / Recommendation Clinical Impression  Pt is 51 y/o female admitted for s/p right ankle ORIF.  Pt plans to return to work in ~2 weeks and educated on possible need for knee walker to maneuver at work.  Plan to perform knee walker mobility and stair negotiation next session.  Pt will benefit from acute PT services to improve overall mobility and prepare for safe d/c home.    PT Assessment  Patient needs continued PT services    Follow Up Recommendations  Home health PT;Supervision - Intermittent (safety evaluation)    Barriers to Discharge        Equipment Recommendations  Rolling walker with 5" wheels;3 in 1 bedside comode (knee walker)    Recommendations for Other Services     Frequency Min 6X/week    Precautions / Restrictions Precautions Precautions: Fall Restrictions Weight Bearing Restrictions: Yes RLE Weight Bearing: Non weight bearing   Pertinent Vitals/Pain 5/10 right LE pain      Mobility  Bed Mobility Bed Mobility: Supine to Sit Supine to Sit: 6: Modified independent (Device/Increase time) Transfers Transfers: Sit to Stand;Stand to Sit Sit to Stand: 4: Min guard;From bed Stand to Sit: 4: Min guard;To bed;To chair/3-in-1 Details for Transfer Assistance: Minguard for safety with cues for hand placement and proper LE placement.  VCs to maintain NWB RLE. Ambulation/Gait Ambulation/Gait Assistance: 4: Min assist Ambulation Distance (Feet): 60 Feet Assistive device: Rolling walker Ambulation/Gait Assistance Details: Minguard for safety with cues for hand placement and maintain right LE NWB Gait Pattern: Step-to pattern;Antalgic    Exercises     PT Diagnosis: Difficulty walking;Abnormality of gait;Generalized weakness;Acute pain  PT Problem List: Decreased strength;Decreased  range of motion;Decreased activity tolerance;Decreased balance;Decreased mobility;Decreased knowledge of use of DME;Pain PT Treatment Interventions: DME instruction;Gait training;Stair training;Functional mobility training;Therapeutic activities;Therapeutic exercise;Balance training;Patient/family education   PT Goals Acute Rehab PT Goals PT Goal Formulation: With patient Time For Goal Achievement: 04/05/12 Potential to Achieve Goals: Good Pt will go Sit to Stand: with modified independence PT Goal: Sit to Stand - Progress: Goal set today Pt will go Stand to Sit: with modified independence PT Goal: Stand to Sit - Progress: Goal set today Pt will Ambulate: >150 feet;with modified independence;with least restrictive assistive device PT Goal: Ambulate - Progress: Goal set today Pt will Go Up / Down Stairs: 3-5 stairs;with min assist;with rolling walker;with least restrictive assistive device PT Goal: Up/Down Stairs - Progress: Goal set today  Visit Information  Last PT Received On: 03/29/12 Assistance Needed: +1    Subjective Data  Subjective: "So walkin shower is better than tub." Patient Stated Goal: To go back to work in one to two weeks.   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 Communication Communication: No difficulties Dominant Hand: Right    Cognition  Overall Cognitive Status: Appears within functional limits for tasks assessed/performed Arousal/Alertness: Awake/alert Orientation Level: Appears intact for tasks  assessed Behavior During Session: [facility] for tasks performed    Extremity/Trunk Assessment Right Upper  Extremity Assessment RUE ROM/Strength/Tone: Within functional levels Left Upper Extremity Assessment LUE ROM/Strength/Tone: Within functional levels Right Lower Extremity Assessment RLE ROM/Strength/Tone: Deficits;Unable to fully assess;Due to pain Left Lower Extremity Assessment LLE ROM/Strength/Tone: Within functional levels   Balance    End of Session PT - End of Session Equipment Utilized During Treatment: Gait belt Activity Tolerance: Patient tolerated treatment well Patient left: in chair;with call bell/phone within reach Nurse Communication: Mobility status;Weight bearing status  GP     Neeraj Housand 03/29/2012, 3:29 PM Jake Shark, PT DPT [phone]

## 2012-03-29 NOTE — Progress Notes (Signed)
ANTICOAGULATION CONSULT NOTE - Follow Up Consult  Pharmacy Consult for Coumadin Indication: VTE prophylaxis s/p ORIF right ankle 8/18  No Known Allergies  Patient Measurements: Height: 5\' 10"  (177.8 cm) Weight: 170 lb (77.111 kg) IBW/kg (Calculated) : 68.5   Vital Signs: Temp: 98.3 F (36.8 C) (08/19 0601) Temp src: Oral (08/19 0601) BP: 134/77 mmHg (08/19 0601) Pulse Rate: 92  (08/19 0601)  Labs:  Basename 03/29/12 0505 03/28/12 0352  HGB -- 13.9  HCT -- 38.9  PLT -- 279  APTT -- --  LABPROT 14.7 --  INR 1.13 --  HEPARINUNFRC -- --  CREATININE -- 0.78  CKTOTAL -- --  CKMB -- --  TROPONINI -- --    Estimated Creatinine Clearance: 87.9 ml/min (by C-G formula based on Cr of 0.78).  Assessment:   POD #1  ORIF right ankle.   Coumadin begun 8/18 with 7.5 mg dose.  Goal of Therapy:  INR 2-3 Monitor platelets by anticoagulation protocol: Yes   Plan:   Will repeat Coumadin 7.5 mg today.  Continue daily PT/INR.  CBC and bmet in am.  Dennie Fetters, RPh Pager: [phone] 03/29/2012,10:43 AM

## 2012-03-30 LAB — BASIC METABOLIC PANEL
BUN: 8 mg/dL (ref 6–23)
Calcium: 8.7 mg/dL (ref 8.4–10.5)
Creatinine, Ser: 0.73 mg/dL (ref 0.50–1.10)
GFR calc Af Amer: >90 mL/min (ref >90)
GFR calc non Af Amer: >90 mL/min (ref >90)
Glucose, Bld: 102 mg/dL — ABNORMAL HIGH (ref 70–99)
Potassium: 4.3 mEq/L (ref 3.5–5.1)

## 2012-03-30 LAB — PROTIME-INR: INR: 1.14 (ref 0.00–1.49)

## 2012-03-30 LAB — CBC
HCT: 35 % — ABNORMAL LOW (ref 36.0–46.0)
Hemoglobin: 11.8 g/dL — ABNORMAL LOW (ref 12.0–15.0)
MCH: 31 pg (ref 26.0–34.0)
MCHC: 33.7 g/dL (ref 30.0–36.0)
RDW: 12.4 % (ref 11.5–15.5)

## 2012-03-30 MED ORDER — ONDANSETRON HCL 4 MG PO TABS: 4.0000 mg | ORAL_TABLET | Freq: Four times a day (QID) | ORAL | Status: AC | PRN

## 2012-03-30 MED ORDER — HYDROMORPHONE HCL 2 MG PO TABS
2.0000 mg | ORAL_TABLET | ORAL | Status: DC | PRN
  Administered 2012-03-30 (×2): 4 mg via ORAL
  Filled 2012-03-30 (×2): qty 2

## 2012-03-30 MED ORDER — RIVAROXABAN 10 MG PO TABS
10.0000 mg | ORAL_TABLET | Freq: Every day | ORAL | Status: DC
  Administered 2012-03-30: 10 mg via ORAL
  Filled 2012-03-30: qty 1

## 2012-03-30 MED ORDER — RIVAROXABAN 10 MG PO TABS: 10.0000 mg | ORAL_TABLET | Freq: Every day | ORAL | Status: AC

## 2012-03-30 MED ORDER — WARFARIN SODIUM 7.5 MG PO TABS
7.5000 mg | ORAL_TABLET | Freq: Once | ORAL | Status: AC
  Administered 2012-03-30: 7.5 mg via ORAL
  Filled 2012-03-30: qty 1

## 2012-03-30 MED ORDER — METHOCARBAMOL 500 MG PO TABS: 500.0000 mg | ORAL_TABLET | Freq: Three times a day (TID) | ORAL | Status: AC

## 2012-03-30 MED ORDER — HYDROMORPHONE HCL 2 MG PO TABS: 2.0000 mg | ORAL_TABLET | ORAL | Status: AC | PRN

## 2012-03-30 NOTE — Progress Notes (Signed)
Subjective: Pt stable - pain controlled on po dilaudid   Objective: Vital signs in last 24 hours: Temp:  [97.5 F (36.4 C)-98.1 F (36.7 C)] 98.1 F (36.7 C) (08/20 1300) Pulse Rate:  [72-84] 79  (08/20 1300) Resp:  [12-18] 18  (08/20 1300) BP: (118-127)/(69-87) 122/80 mmHg (08/20 1300) SpO2:  [94 %-98 %] 98 % (08/20 1300)  Intake/Output from previous day:   Intake/Output this shift: Total I/O In: 480 [P.O.:480] Out: 4 [Urine:4]  Exam:  toes mobile perfused and sensate  Labs:  Basename 03/30/12 0500 03/28/12 0352  HGB 11.8* 13.9    Basename 03/30/12 0500 03/28/12 0352  WBC 9.1 8.2  RBC 3.81* 4.36  HCT 35.0* 38.9  PLT 275 279    Basename 03/30/12 0500 03/28/12 0352  NA 138 139  K 4.3 3.3*  CL 104 102  CO2 29 27  BUN 8 13  CREATININE 0.73 0.78  GLUCOSE 102* 95  CALCIUM 8.7 8.7    Basename 03/30/12 0500 03/29/12 0505  LABPT -- --  INR 1.14 1.13    Assessment/Plan: Dc to home - change to po xarelto for dvt prophylaxis per patient request - fu next week    Ronell Duffus SCOTT 03/30/2012, 6:00 PM

## 2012-03-30 NOTE — Discharge Summary (Signed)
Physician Discharge Summary  Patient ID: Erin Whitehead MRN: [id number] DOB/AGE: 1960/10/25 51 y.o.  Admit date: 03/28/2012 Discharge date: 03/30/2012  Admission Diagnoses:  Ankle fracture  Discharge Diagnoses:  Same  Surgeries: Procedure(s): OPEN REDUCTION INTERNAL FIXATION (ORIF) ANKLE FRACTURE on 03/28/2012   Consultants:    Discharged Condition: Stable  Hospital Course: Keliah Harned is an 51 y.o. female who was admitted 03/28/2012 with a chief complaint of  Chief Complaint  Patient presents with  . Ankle Pain  . Leg Injury  , and found to have a diagnosis of ankle fracture  They were brought to the operating room on 03/28/2012 and underwent the above named procedures. She mobilized with PT and was on po pain meds at time of dc. Xarelto for dvt prophylaxis.  Antibiotics given:  Anti-infectives     Start     Dose/Rate Route Frequency Ordered Stop   03/28/12 1900   ceFAZolin (ANCEF) IVPB 1 g/50 mL premix        1 g 100 mL/hr over 30 Minutes Intravenous Every 8 hours 03/28/12 1530 03/29/12 1227        .  Recent vital signs:  Filed Vitals:   03/30/12 1300  BP: 122/80  Pulse: 79  Temp: 98.1 F (36.7 C)  Resp: 18    Recent laboratory studies:  Results for orders placed during the hospital encounter of 03/28/12  CBC WITH DIFFERENTIAL      Component Value Range   WBC 8.2  4.0 - 10.5 K/uL   RBC 4.36  3.87 - 5.11 MIL/uL   Hemoglobin 13.9  12.0 - 15.0 g/dL   HCT 11.9  14.7 - 82.9 %   MCV 89.2  78.0 - 100.0 fL   MCH 31.9  26.0 - 34.0 pg   MCHC 35.7  30.0 - 36.0 g/dL   RDW 56.2  13.0 - 86.5 %   Platelets 279  150 - 400 K/uL   Neutrophils Relative 58  43 - 77 %   Neutro Abs 4.8  1.7 - 7.7 K/uL   Lymphocytes Relative 33  12 - 46 %   Lymphs Abs 2.7  0.7 - 4.0 K/uL   Monocytes Relative 6  3 - 12 %   Monocytes Absolute 0.5  0.1 - 1.0 K/uL   Eosinophils Relative 2  0 - 5 %   Eosinophils Absolute 0.2  0.0 - 0.7 K/uL   Basophils Relative 1  0 - 1 %   Basophils Absolute  0.0  0.0 - 0.1 K/uL  BASIC METABOLIC PANEL      Component Value Range   Sodium 139  135 - 145 mEq/L   Potassium 3.3 (*) 3.5 - 5.1 mEq/L   Chloride 102  96 - 112 mEq/L   CO2 27  19 - 32 mEq/L   Glucose, Bld 95  70 - 99 mg/dL   BUN 13  6 - 23 mg/dL   Creatinine, Ser 7.84  0.50 - 1.10 mg/dL   Calcium 8.7  8.4 - 69.6 mg/dL   GFR calc non Af Amer >90  >90 mL/min   GFR calc Af Amer >90  >90 mL/min  TYPE AND SCREEN      Component Value Range   ABO/RH(D) O POS     Antibody Screen NEG     Sample Expiration 03/31/2012    ETHANOL      Component Value Range   Alcohol, Ethyl (B) 159 (*) 0 - 11 mg/dL  ABO/RH      Component  Value Range   ABO/RH(D) O POS    PROTIME-INR      Component Value Range   Prothrombin Time 14.7  11.6 - 15.2 seconds   INR 1.13  0.00 - 1.49  PROTIME-INR      Component Value Range   Prothrombin Time 14.8  11.6 - 15.2 seconds   INR 1.14  0.00 - 1.49  CBC      Component Value Range   WBC 9.1  4.0 - 10.5 K/uL   RBC 3.81 (*) 3.87 - 5.11 MIL/uL   Hemoglobin 11.8 (*) 12.0 - 15.0 g/dL   HCT 14.7 (*) 82.9 - 56.2 %   MCV 91.9  78.0 - 100.0 fL   MCH 31.0  26.0 - 34.0 pg   MCHC 33.7  30.0 - 36.0 g/dL   RDW 13.0  86.5 - 78.4 %   Platelets 275  150 - 400 K/uL  BASIC METABOLIC PANEL      Component Value Range   Sodium 138  135 - 145 mEq/L   Potassium 4.3  3.5 - 5.1 mEq/L   Chloride 104  96 - 112 mEq/L   CO2 29  19 - 32 mEq/L   Glucose, Bld 102 (*) 70 - 99 mg/dL   BUN 8  6 - 23 mg/dL   Creatinine, Ser 6.96  0.50 - 1.10 mg/dL   Calcium 8.7  8.4 - 29.5 mg/dL   GFR calc non Af Amer >90  >90 mL/min   GFR calc Af Amer >90  >90 mL/min    Discharge Medications:   Medication List  As of 03/30/2012  6:16 PM   TAKE these medications         escitalopram 10 MG tablet   Commonly known as: LEXAPRO   Take 10 mg by mouth at bedtime.      HYDROmorphone 2 MG tablet   Commonly known as: DILAUDID   Take 1-2 tablets (2-4 mg total) by mouth every 4 (four) hours as needed.       methocarbamol 500 MG tablet   Commonly known as: ROBAXIN   Take 1 tablet (500 mg total) by mouth 3 (three) times daily.      multivitamin with minerals Tabs   Take 1 tablet by mouth daily.      ondansetron 4 MG tablet   Commonly known as: ZOFRAN   Take 1 tablet (4 mg total) by mouth every 6 (six) hours as needed for nausea.      PRESCRIPTION MEDICATION   Take 1 tablet by mouth daily. Oral contraceptive      rivaroxaban 10 MG Tabs tablet   Commonly known as: XARELTO   Take 1 tablet (10 mg total) by mouth daily.            Diagnostic Studies: Dg Chest 2 View  03/28/2012  *RADIOLOGY REPORT*  Clinical Data: Fall.  Ankle fracture.  CHEST - 2 VIEW  Comparison: None.  Findings: Mild hyperinflation. The heart size and pulmonary vascularity are normal. The lungs appear clear and expanded without focal air space disease or consolidation. No blunting of the costophrenic angles.  No pneumothorax.  Mediastinal contours appear intact.  IMPRESSION: No evidence of active pulmonary disease.  Original Report Authenticated By: Marlon Pel, M.D.   Dg Ankle 2 Views Right  03/28/2012  *RADIOLOGY REPORT*  Clinical Data: Postreduction distal right tib-fib fractures.  RIGHT ANKLE - 2 VIEW  Comparison: 03/28/2012 at 0357 hours  Findings: Right medial and lateral malleolar fractures again  demonstrated.  There is improved angulation and position although there is still residual lateral displacement of distal fracture fragment and talus.  Fractures can also be seen along the anterior and posterior malleoli of the distal tibia.  Diffuse soft tissue swelling.  IMPRESSION: Improved position of fracture fragments with mild residual lateral displacement of the medial and lateral malleolar fractures. Additionally, fractures of the posterior and anterior tibial malleoli are now demonstrated.  Original Report Authenticated By: Marlon Pel, M.D.   Dg Ankle Complete Right  03/28/2012  *RADIOLOGY REPORT*   Clinical Data: Fracture fixation.  RIGHT ANKLE - COMPLETE 3+ VIEW,DG C-ARM 61-120 MIN  Comparison: None.  Findings: Two intraoperative fluoroscopic spot views of the right ankle are provided. Images demonstrate two lag screws fixing a medial malleolar fracture and plate and screws fixing a distal fibular fracture.  IMPRESSION: ORIF medial and lateral malleolar fractures.  Original Report Authenticated By: Bernadene Bell. D'ALESSIO, M.D.   Dg Ankle Right Port  03/28/2012  *RADIOLOGY REPORT*  Clinical Data: 51 year old female status post right ankle ORIF.  PORTABLE RIGHT ANKLE - 2 VIEW  Comparison: Intraoperative views 1248 hours the same day and earlier.  Findings: Casting material now present about the right ankle status post ORIF of the distal right fibula and medial malleolus.  Mortise joint alignment appears near anatomic.  Hardware appears intact.  IMPRESSION: ORIF right ankle with no adverse features.  Original Report Authenticated By: Harley Hallmark, M.D.   Dg Ankle Right Port  03/28/2012  *RADIOLOGY REPORT*  Clinical Data: Pain after injury to the right ankle.  PORTABLE RIGHT ANKLE - 2 VIEW  Comparison: None.  Findings: Transverse fracture of the medial malleolus extending to the articular surface.  Lateral displacement of the distal fracture fragment and lateral dislocation of the talus with respect to the tibia.  Oblique comminuted fracture of the distal fibula with overriding of fracture fragments and lateral angulation of distal fracture fragment.  Tiny osseous fragment adjacent to the cuboidal bone may represent avulsion fragment.  Soft tissue swelling.  No focal bone lesion or bone destruction.  IMPRESSION: Displaced and angulated bimalleolar fractures of the right ankle with possible avulsion fracture adjacent to the cuboidal bone.  Original Report Authenticated By: Marlon Pel, M.D.   Dg C-arm [phone] Min  03/28/2012  *RADIOLOGY REPORT*  Clinical Data: Fracture fixation.  RIGHT ANKLE -  COMPLETE 3+ VIEW,DG C-ARM 61-120 MIN  Comparison: None.  Findings: Two intraoperative fluoroscopic spot views of the right ankle are provided. Images demonstrate two lag screws fixing a medial malleolar fracture and plate and screws fixing a distal fibular fracture.  IMPRESSION: ORIF medial and lateral malleolar fractures.  Original Report Authenticated By: Bernadene Bell. Maricela Curet, M.D.    Disposition: Final discharge disposition not confirmed  Discharge Orders    Future Orders Please Complete By Expires   Diet - low sodium heart healthy      Call MD / Call 911      Comments:   If you experience chest pain or shortness of breath, CALL 911 and be transported to the hospital emergency room.  If you develope a fever above 101 F, pus (white drainage) or increased drainage or redness at the wound, or calf pain, call your surgeon's office.   Constipation Prevention      Comments:   Drink plenty of fluids.  Prune juice may be helpful.  You may use a stool softener, such as Colace (over the counter) 100 mg twice a day.  Use MiraLax (over the counter) for constipation as needed.   Increase activity slowly as tolerated      Discharge instructions      Comments:   1. Elevate leg 2. Keep splint dry 3.Return to clinic next Monday for recheck - call [phone] for appt with Dr. August Saucer         Signed: Rise Paganini SCOTT 03/30/2012, 6:16 PM

## 2012-03-30 NOTE — Care Management Note (Signed)
    Page 1 of 2   03/30/2012     3:39:58 PM   CARE MANAGEMENT NOTE 03/30/2012  Patient:  Erin Whitehead, Erin Whitehead   Account Number:  [id number]  Date Initiated:  03/30/2012  Documentation initiated by:  Letha Cape  Subjective/Objective Assessment:   dx bimalleolar ankle fx  admit- lives with spouse.     Action/Plan:   pt eval- recs hhpt   Anticipated DC Date:  03/30/2012   Anticipated DC Plan:  HOME W HOME HEALTH SERVICES      DC Planning Services  CM consult      [facility] Choice  HOME HEALTH   Choice offered to / List presented to:  C-1 Patient   DME arranged  BEDSIDE COMMODE  WALKER - ROLLING      DME agency  [facility]     HH arranged  HH-1 RN  HH-2 PT      [facility] agency  [facility]   Status of service:  Completed, signed off Medicare Important Message given?   (If response is "NO", the following Medicare IM given date fields will be blank) Date Medicare IM given:   Date Additional Medicare IM given:    Discharge Disposition:  HOME W HOME HEALTH SERVICES  Per UR Regulation:  Reviewed for med. necessity/level of care/duration of stay  If discussed at Long Length of Stay Meetings, dates discussed:    Comments:  03/30/12 15:36 Letha Cape RN, BSN [phone] patient lives with spouse, patient has medication coverage and transportation.  Patient chose [facility] from agency list for HHRN/PT and bsc and rolling walker.  Patient also wanted to know about renting a knee scooter, informed her [facility] medical supply store will rent this to her for $35/week  or $80 /month.  I gave her the phone number for [facility] supply store and she will go by tomorrow to pick this up, per Barbara Cower at [facility] store states she does not need a script for this she just needs to come in and fill out paper work.   Patient is for dc today.   Referral makde to [facility], [facility] notified for hhrn/pt, Soc will begin 24- 48 hrs post discharge.

## 2012-03-30 NOTE — Progress Notes (Signed)
Physical Therapy Treatment Patient Details Name: Erin Whitehead MRN: [id number] DOB: 1960/11/16 Today's Date: 03/30/2012 Time: 1020-1050 PT Time Calculation (min): 30 min  PT Assessment / Plan / Recommendation Comments on Treatment Session  Pt able to perform stair negotiation and maneuver with knee walker.  Pt plans to get knee walker on pts on  Pt will need RW and 3n1 at d/c and will benefit from HHPT for safety evaluation.    Follow Up Recommendations  Home health PT;Supervision - Intermittent    Barriers to Discharge        Equipment Recommendations  Rolling walker with 5" wheels;3 in 1 bedside comode    Recommendations for Other Services    Frequency Min 6X/week   Plan Discharge plan remains appropriate;Frequency remains appropriate    Precautions / Restrictions Precautions Precautions: Fall Restrictions Weight Bearing Restrictions: Yes RLE Weight Bearing: Non weight bearing   Pertinent Vitals/Pain Mild c/o pain but does not rate    Mobility  Bed Mobility Bed Mobility: Supine to Sit Supine to Sit: 6: Modified independent (Device/Increase time) Transfers Transfers: Sit to Stand;Stand to Sit Sit to Stand: 5: Supervision;From bed;From chair/3-in-1 Stand to Sit: 5: Supervision;To chair/3-in-1 Details for Transfer Assistance: Supervision for safety with cues for hand placement Ambulation/Gait Ambulation/Gait Assistance: 4: Min guard Ambulation Distance (Feet): 60 Feet (100' use of knee walker) Assistive device: Rolling walker;Other (Comment) (knee walker) Ambulation/Gait Assistance Details: Minguard for safety with cues for RW placement and proper sequence Gait Pattern: Step-to pattern;Antalgic Stairs: Yes Stairs Assistance: 4: Min assist Stairs Assistance Details (indicate cue type and reason): (A) to manage RW and maintain balance with cues for proper sequence Stair Management Technique: Backwards;With walker Number of Stairs: 3     Exercises     PT Diagnosis:      PT Problem List:   PT Treatment Interventions:     PT Goals Acute Rehab PT Goals PT Goal Formulation: With patient Time For Goal Achievement: 04/05/12 Potential to Achieve Goals: Good Pt will go Sit to Stand: with modified independence PT Goal: Sit to Stand - Progress: Met Pt will go Stand to Sit: with modified independence PT Goal: Stand to Sit - Progress: Progressing toward goal Pt will Ambulate: >150 feet;with modified independence;with least restrictive assistive device PT Goal: Ambulate - Progress: Progressing toward goal Pt will Go Up / Down Stairs: 3-5 stairs;with min assist;with rolling walker;with least restrictive assistive device PT Goal: Up/Down Stairs - Progress: Met  Visit Information  Last PT Received On: 03/30/12 Assistance Needed: +1    Subjective Data      Cognition  Overall Cognitive Status: Appears within functional limits for tasks assessed/performed Arousal/Alertness: Awake/alert Orientation Level: Appears intact for tasks assessed Behavior During Session: [facility] for tasks performed    Balance     End of Session PT - End of Session Equipment Utilized During Treatment: Gait belt (knee walker) Activity Tolerance: Patient tolerated treatment well Patient left: in chair;with call bell/phone within reach Nurse Communication: Mobility status;Weight bearing status   GP     Asa Fath 03/30/2012, 1:27 PM Jake Shark, PT DPT [phone]

## 2012-03-30 NOTE — Progress Notes (Signed)
ANTICOAGULATION CONSULT NOTE - Follow Up Consult  Pharmacy Consult for Coumadin Indication: VTE prophylaxis s/p ORIF right ankle 8/18  No Known Allergies  Patient Measurements: Height: 5\' 10"  (177.8 cm) Weight: 170 lb (77.111 kg) IBW/kg (Calculated) : 68.5   Vital Signs: Temp: 98.1 F (36.7 C) (08/20 1300) BP: 122/80 mmHg (08/20 1300) Pulse Rate: 79  (08/20 1300)  Labs:  Basename 03/30/12 0500 03/29/12 0505 03/28/12 0352  HGB 11.8* -- 13.9  HCT 35.0* -- 38.9  PLT 275 -- 279  APTT -- -- --  LABPROT 14.8 14.7 --  INR 1.14 1.13 --  HEPARINUNFRC -- -- --  CREATININE 0.73 -- 0.78  CKTOTAL -- -- --  CKMB -- -- --  TROPONINI -- -- --    Estimated Creatinine Clearance: 87.9 ml/min (by C-G formula based on Cr of 0.73).  Assessment:     POD #2 ORIF right ankle.   Has had Coumadin 7.5 mg daily x 2 days.    Patient asked about alternative anticoagulants, since she is averse to blood draws, relates "almost passes out".  Informed her of usual outpatient fingersticks.  She will discuss with Dr. August Saucer.  Goal of Therapy:  INR 2-3 Monitor platelets by anticoagulation protocol: Yes   Plan:   Coumadin 7.5 mg again today.  Continue daily PT/INR while in the hospital.  Dennie Fetters, RPh Pager: [phone] 03/30/2012,4:40 PM

## 2012-04-02 ENCOUNTER — Encounter (HOSPITAL_COMMUNITY): Payer: Self-pay

## 2012-06-01 ENCOUNTER — Encounter (HOSPITAL_COMMUNITY): Payer: Self-pay

## 2013-12-21 IMAGING — CR DG ANKLE PORT 2V*R*
2 series · 2 of 2 positions shown · non-contrast
Comparison: None.

CLINICAL DATA: Pain after injury to the right ankle.

PORTABLE RIGHT ANKLE - 2 VIEW

[AP]
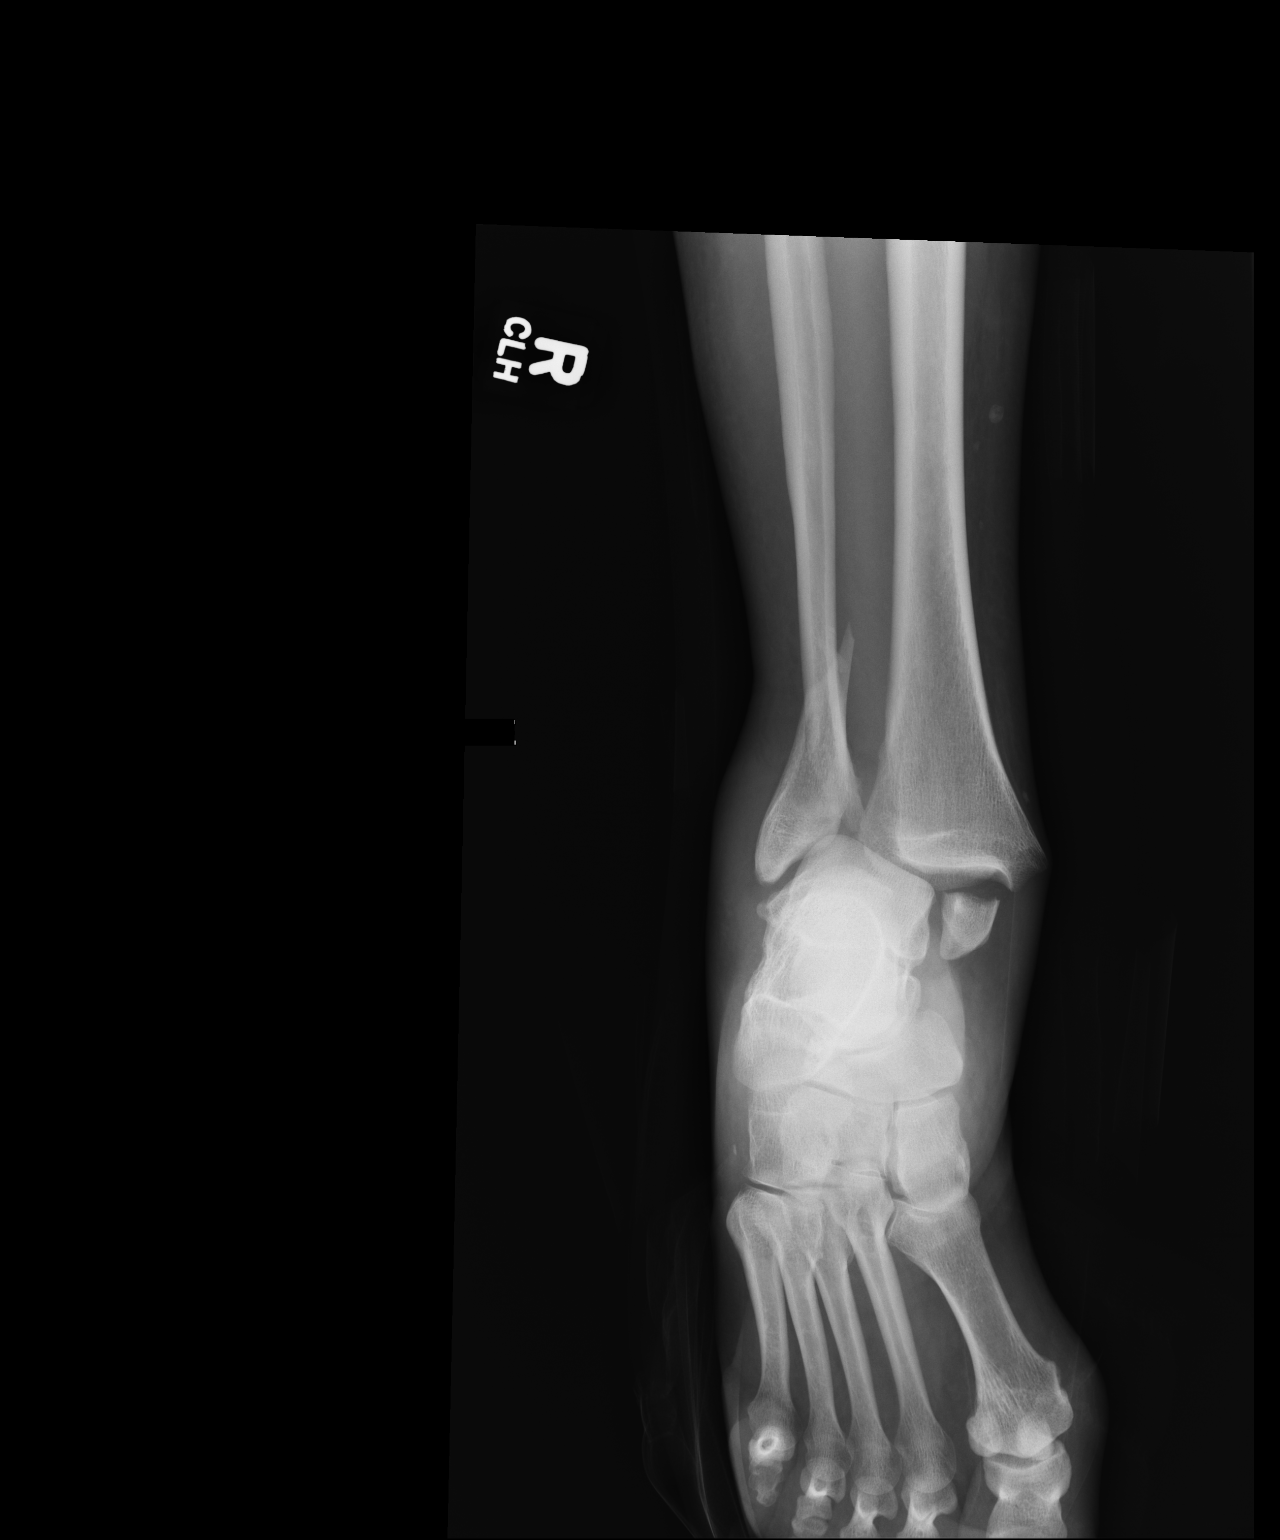

[lateral]
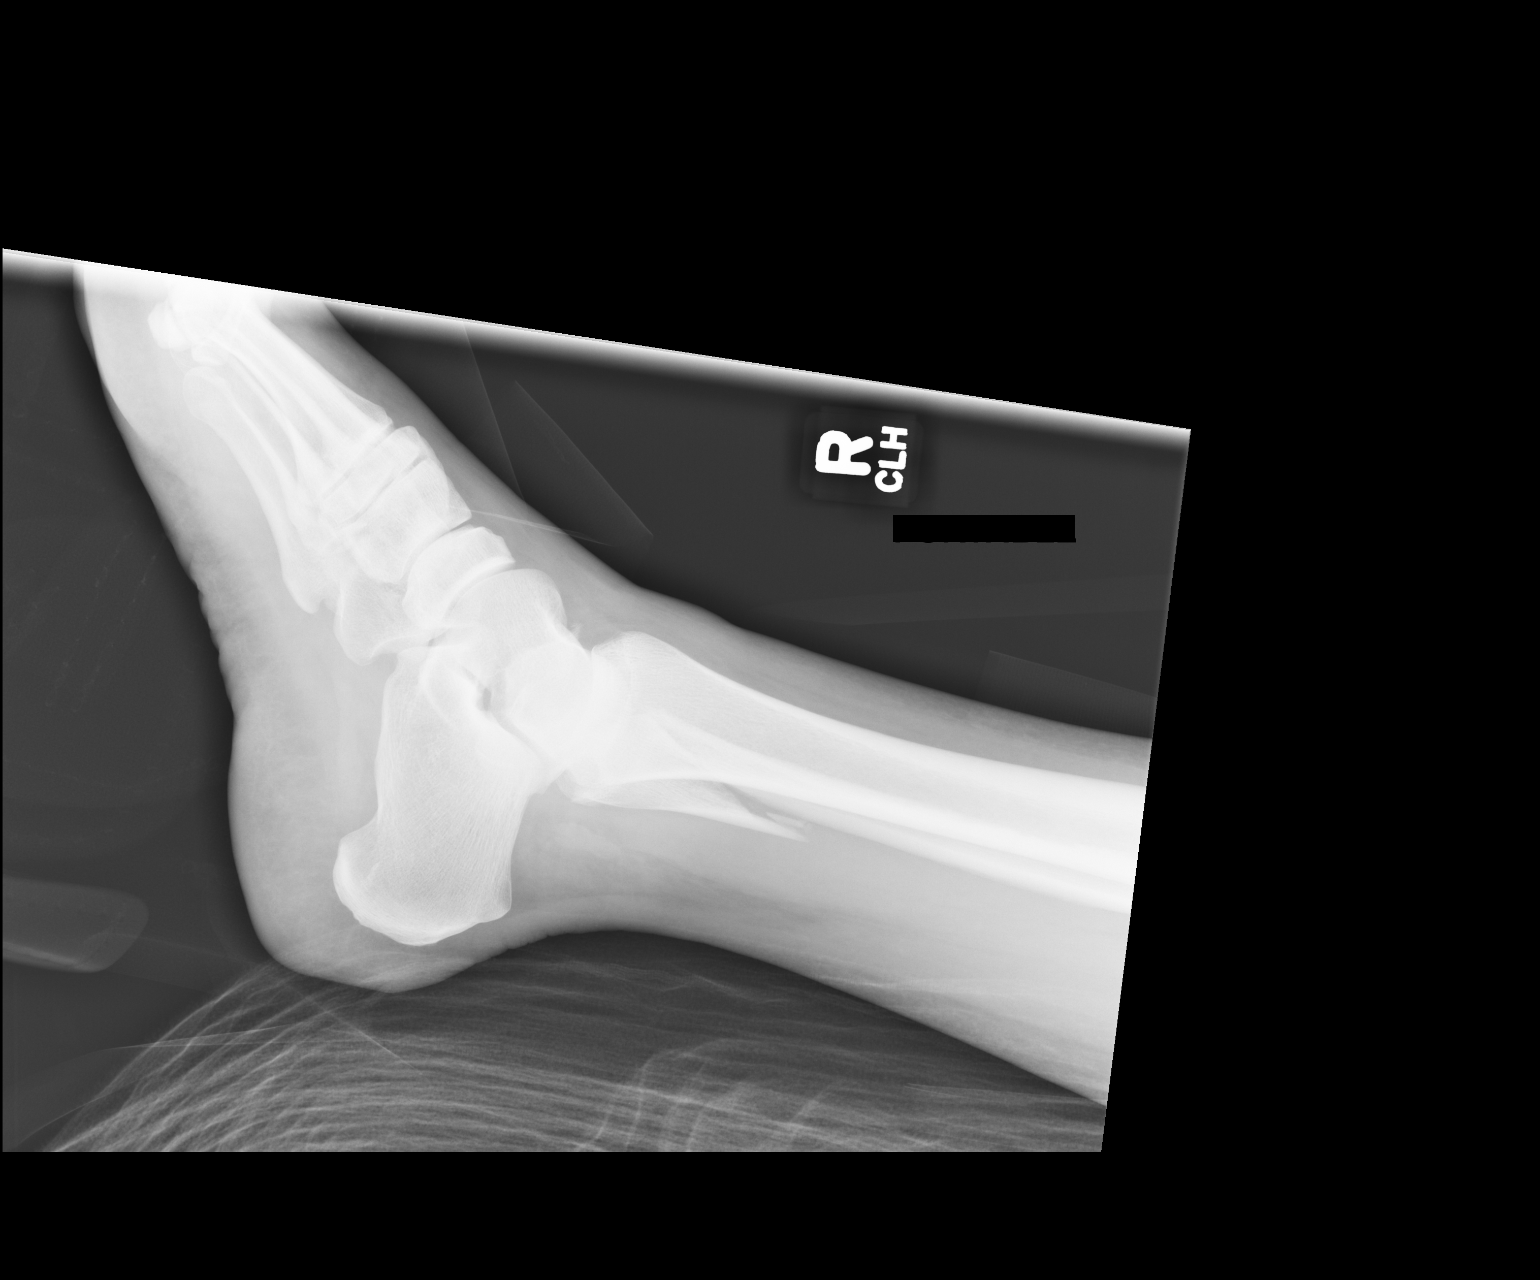

[2 of 2 positions shown; findings below may reference images not displayed]

FINDINGS: Transverse fracture of the medial malleolus extending to
the articular surface.  Lateral displacement of the distal fracture
fragment and lateral dislocation of the talus with respect to the
tibia.  Oblique comminuted fracture of the distal fibula with
overriding of fracture fragments and lateral angulation of distal
fracture fragment.  Tiny osseous fragment adjacent to the cuboidal
bone may represent avulsion fragment.  Soft tissue swelling.  No
focal bone lesion or bone destruction.
IMPRESSION: Displaced and angulated bimalleolar fractures of the right ankle
with possible avulsion fracture adjacent to the cuboidal bone.

## 2014-06-12 ENCOUNTER — Encounter (HOSPITAL_COMMUNITY): Payer: Self-pay | Admitting: Orthopedic Surgery

## 2014-09-05 ENCOUNTER — Ambulatory Visit: Payer: 59 | Attending: Orthopedic Surgery | Admitting: Physical Therapy

## 2014-09-05 DIAGNOSIS — M7552 Bursitis of left shoulder: Secondary | ICD-10-CM | POA: Diagnosis not present

## 2014-09-05 DIAGNOSIS — M25512 Pain in left shoulder: Secondary | ICD-10-CM | POA: Diagnosis present

## 2014-09-07 ENCOUNTER — Ambulatory Visit: Payer: 59 | Admitting: Physical Therapy

## 2014-09-07 DIAGNOSIS — M25512 Pain in left shoulder: Secondary | ICD-10-CM | POA: Diagnosis not present

## 2014-09-12 ENCOUNTER — Ambulatory Visit: Payer: 59 | Admitting: Physical Therapy

## 2014-09-12 ENCOUNTER — Ambulatory Visit: Payer: 59 | Attending: Orthopedic Surgery | Admitting: Physical Therapy

## 2014-09-12 DIAGNOSIS — M7552 Bursitis of left shoulder: Secondary | ICD-10-CM | POA: Insufficient documentation

## 2014-09-12 DIAGNOSIS — M25512 Pain in left shoulder: Secondary | ICD-10-CM | POA: Insufficient documentation

## 2014-09-14 ENCOUNTER — Ambulatory Visit: Payer: 59 | Admitting: Physical Therapy

## 2014-09-14 DIAGNOSIS — M25512 Pain in left shoulder: Secondary | ICD-10-CM | POA: Diagnosis not present

## 2017-02-28 ENCOUNTER — Encounter (HOSPITAL_COMMUNITY): Payer: Self-pay | Admitting: *Deleted

## 2017-02-28 ENCOUNTER — Ambulatory Visit (HOSPITAL_COMMUNITY)
Admission: EM | Admit: 2017-02-28 | Discharge: 2017-02-28 | Disposition: A | Discharge status: Home / Self Care | Payer: Commercial Managed Care - PPO

## 2017-02-28 DIAGNOSIS — M79645 Pain in left finger(s): Secondary | ICD-10-CM

## 2017-02-28 DIAGNOSIS — M7989 Other specified soft tissue disorders: Secondary | ICD-10-CM

## 2017-02-28 NOTE — ED Notes (Signed)
Rings  Cut  Off    By  Ander SladeBill   [place]       2  Yellow  Rings    Handed  Back   To  Pt  By  Selena BattenKim lapan   In  A  specemin  Cup

## 2017-02-28 NOTE — ED Triage Notes (Signed)
Pt  Needs  Ring  Cut  Off

## 2017-02-28 NOTE — ED Provider Notes (Signed)
CSN: [id number]     Arrival date & time 02/28/17  1848 History   None    Chief Complaint  Patient presents with  . Hand Pain   (Consider location/radiation/quality/duration/timing/severity/associated sxs/prior Treatment) Left ring finger with swelling and needs 2 rings cut off.  She has tried using dental floss and has tried using surgical lube w/o success.   The history is provided by the patient.  Hand Pain  This is a new problem. The current episode started more than 2 days ago. The problem occurs constantly. The problem has not changed since onset.The symptoms are aggravated by twisting. She has tried nothing for the symptoms.    Past Medical History:  Diagnosis Date  . Depression    Past Surgical History:  Procedure Laterality Date  . ABDOMINAL HERNIA REPAIR  2003  . APPENDECTOMY    . breast augmentation, tummy tuck    . ORIF ANKLE FRACTURE  03/28/2012   Procedure: OPEN REDUCTION INTERNAL FIXATION (ORIF) ANKLE FRACTURE;  Surgeon: Cammy CopaGregory Scott Dean, MD;  [facility];  Service: Orthopedics;  Laterality: Right;  . TONSILLECTOMY     Family History  Problem Relation Age of Onset  . ALS Mother   . Heart attack Father    Social History  Substance Use Topics  . Smoking status: Never Smoker  . Smokeless tobacco: Not on file  . Alcohol use 15.0 oz/week    25 Glasses of wine per week   OB History    Gravida Para Term Preterm AB Living   2 2 2          SAB TAB Ectopic Multiple Live Births                 Review of Systems  Constitutional: Negative.   HENT: Negative.   Eyes: Negative.   Respiratory: Negative.   Cardiovascular: Negative.   Gastrointestinal: Negative.   Endocrine: Negative.   Genitourinary: Negative.   Musculoskeletal: Positive for arthralgias.  Skin: Positive for color change and wound.       Ring finger red and swollen  Allergic/Immunologic: Negative.   Neurological: Negative.   Hematological: Negative.   Psychiatric/Behavioral: Negative.      Allergies  Patient has no known allergies.  Home Medications   Prior to Admission medications   Medication Sig Start Date End Date Taking? Authorizing Provider  escitalopram (LEXAPRO) 10 MG tablet Take 10 mg by mouth at bedtime.    [provider]  Multiple Vitamin (MULTIVITAMIN WITH MINERALS) TABS Take 1 tablet by mouth daily.    [provider]  PRESCRIPTION MEDICATION Take 1 tablet by mouth daily. Oral contraceptive    [provider]  rivaroxaban (XARELTO) 10 MG TABS tablet Take 1 tablet (10 mg total) by mouth daily. 03/30/12   Cammy Copaean, Scott Gregory, MD   Meds Ordered and Administered this Visit  Medications - No data to display  BP 132/78 (BP Location: Right Arm)   Pulse 82   Temp 98.6 F (37 C) (Oral)   Resp 18   SpO2 100%  No data found.   Physical Exam  Constitutional: She appears well-developed and well-nourished.  HENT:  Head: Normocephalic and atraumatic.  Eyes: Pupils are equal, round, and reactive to light. Conjunctivae and EOM are normal.  Neck: Normal range of motion. Neck supple.  Cardiovascular: Normal rate, regular rhythm and normal heart sounds.   Pulmonary/Chest: Effort normal and breath sounds normal.  Skin:  Left ring finger swollen and tender and red.  Nursing note and vitals reviewed.   Urgent Care Course     Procedures (including critical care time)  Labs Review Labs Reviewed - No data to display  Imaging Review No results found.   Visual Acuity Review  Right Eye Distance:   Left Eye Distance:   Bilateral Distance:    Right Eye Near:   Left Eye Near:    Bilateral Near:         MDM   1. Finger pain, left   2. Swelling of left ring finger     2 Rings removed and given to patient.   Deatra Canter, [place] 02/28/17 1930
